# Patient Record
Sex: Male | Born: 1990 | Race: Black or African American | Hispanic: No | Marital: Single
Health system: Southern US, Community
[De-identification: ages and names within clinical notes are randomized; demographics above are authoritative.]

---
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
## 2009-05-04 ENCOUNTER — Emergency Department (HOSPITAL_COMMUNITY): Admission: EM | Admit: 2009-05-04 | Discharge: 2009-05-05 | Payer: Self-pay | Admitting: Emergency Medicine

## 2013-01-22 ENCOUNTER — Emergency Department (HOSPITAL_COMMUNITY): Payer: Self-pay

## 2013-01-22 ENCOUNTER — Emergency Department (HOSPITAL_COMMUNITY)
Admission: EM | Admit: 2013-01-22 | Discharge: 2013-01-22 | Disposition: A | Discharge status: Home / Self Care | Payer: Self-pay | Attending: Emergency Medicine | Admitting: Emergency Medicine

## 2013-01-22 ENCOUNTER — Encounter (HOSPITAL_COMMUNITY): Payer: Self-pay

## 2013-01-22 ENCOUNTER — Other Ambulatory Visit: Payer: Self-pay

## 2013-01-22 DIAGNOSIS — R0789 Other chest pain: Secondary | ICD-10-CM | POA: Insufficient documentation

## 2013-01-22 DIAGNOSIS — F17200 Nicotine dependence, unspecified, uncomplicated: Secondary | ICD-10-CM | POA: Insufficient documentation

## 2013-01-22 NOTE — ED Provider Notes (Signed)
History     CSN: [id number]  Arrival date & time 01/22/13  0006   First MD Initiated Contact with Patient 01/22/13 0211      Chief Complaint  Patient presents with  . Shortness of Breath   HPI  History provided by the patient. Patient is a 22 year old male with no significant PMH who is a current every day smoker who presents with complaints of left-sided chest pain. Patient reports having sharp stabbing pain to his left chest area earlier yesterday using while watching a basketball game. Pain remained persistent and seem to worsen with deep breathing. The pain "took his breath away" and patient reports feeling some shortness of breath. Pain was not worse with movements or activity. Pain did not radiate. He denies any other aggravating or alleviating factors. The patient states he was eating and smoking a cigarette prior to having symptoms. There was no associated diaphoresis or nausea. No coughing. No hemoptysis. He denies any recent travel. No pain or swelling extremities. No prior history of DVT or PE. Denies any other associated symptoms.     History reviewed. No pertinent past medical history.  History reviewed. No pertinent past surgical history.  History reviewed. No pertinent family history.  History  Substance Use Topics  . Smoking status: Current Every Day Smoker -- 0.50 packs/day  . Smokeless tobacco: Not on file  . Alcohol Use: No     Comment: social      Review of Systems  Constitutional: Negative for fever, chills and diaphoresis.  Respiratory: Positive for shortness of breath. Negative for cough.   Cardiovascular: Positive for chest pain. Negative for palpitations.  Gastrointestinal: Negative for nausea.  All other systems reviewed and are negative.    Allergies  Review of patient's allergies indicates no known allergies.  Home Medications   Current Outpatient Rx  Name  Route  Sig  Dispense  Refill  . ibuprofen (ADVIL,MOTRIN) 200 MG tablet   Oral  Take 200 mg by mouth every 6 (six) hours as needed for pain.           BP 127/75  Pulse 77  Temp(Src) 98.8 F (37.1 C) (Oral)  Resp 20  SpO2 100%  Physical Exam  Nursing note and vitals reviewed. Constitutional: He appears well-developed and well-nourished. No distress.  HENT:  Head: Normocephalic and atraumatic.  Eyes: Conjunctivae and EOM are normal. Pupils are equal, round, and reactive to light.  Cardiovascular: Normal rate and regular rhythm.   No murmur heard. Pulmonary/Chest: Effort normal and breath sounds normal. No respiratory distress. He has no wheezes. He has no rales. He exhibits no tenderness.  Abdominal: Soft. There is no tenderness. There is no rebound and no guarding.  Musculoskeletal: Normal range of motion. He exhibits no edema and no tenderness.  No clinical signs concerning for DVT.  Neurological: He is alert.  Skin: Skin is warm.  Psychiatric: He has a normal mood and affect. His behavior is normal.    ED Course  Procedures       Dg Chest 2 View  01/22/2013   *RADIOLOGY REPORT*  Clinical Data: New onset of mid chest pain and pressure.  History of smoking.  CHEST - 2 VIEW  Comparison: None.  Findings: The lungs are well-aerated and clear.  There is no evidence of focal opacification, pleural effusion or pneumothorax.  The heart is normal in size; the mediastinal contour is within normal limits.  No acute osseous abnormalities are seen.  IMPRESSION: No acute cardiopulmonary process  seen.   Original Report Authenticated By: Tonia Ghent, M.D.     1. Atypical chest pain       MDM  2:30AM is a patient seen and evaluated. Patient well-appearing in no acute distress. Currently has no pain at all. Normal respirations, O2 sats and heart rate. He is PERC negative.  Pain was sharp and atypical. Unremarkable ECG and chest x-ray. Patient without other significant risk factors for ACS or concerning cause of symptoms. This time we'll discharge home and  encourage PCP followup.     Date: 01/22/2013  Rate: 72  Rhythm: normal sinus rhythm  QRS Axis: normal  Intervals: normal  ST/T Wave abnormalities: nonspecific T wave changes  Conduction Disutrbances:none  Narrative Interpretation:   Old EKG Reviewed: none available          Angus Seller, PA-C 01/22/13 [phone]

## 2013-01-22 NOTE — ED Notes (Signed)
Per pt, shortness of breath starting tonight.  No cough congestion.  Chest discomfort noted.

## 2013-01-22 NOTE — ED Provider Notes (Signed)
Medical screening examination/treatment/procedure(s) were performed by non-physician practitioner and as supervising physician I was immediately available for consultation/collaboration.  John-Adam Dacy Enrico, M.D.   John-Adam Margaruite Top, MD 01/22/13 0529 

## 2014-02-25 ENCOUNTER — Encounter (HOSPITAL_COMMUNITY): Payer: Self-pay | Admitting: Emergency Medicine

## 2014-02-25 ENCOUNTER — Emergency Department (HOSPITAL_COMMUNITY)
Admission: EM | Admit: 2014-02-25 | Discharge: 2014-02-25 | Disposition: A | Discharge status: Home / Self Care | Payer: Self-pay | Attending: Emergency Medicine | Admitting: Emergency Medicine

## 2014-02-25 DIAGNOSIS — F17200 Nicotine dependence, unspecified, uncomplicated: Secondary | ICD-10-CM | POA: Insufficient documentation

## 2014-02-25 DIAGNOSIS — R21 Rash and other nonspecific skin eruption: Secondary | ICD-10-CM | POA: Insufficient documentation

## 2014-02-25 DIAGNOSIS — Z113 Encounter for screening for infections with a predominantly sexual mode of transmission: Secondary | ICD-10-CM | POA: Insufficient documentation

## 2014-02-25 MED ORDER — HYDROCORTISONE 2.5 % EX LOTN: TOPICAL_LOTION | Freq: Two times a day (BID) | CUTANEOUS | Status: DC

## 2014-02-25 MED ORDER — PREDNISONE 20 MG PO TABS: 60.0000 mg | ORAL_TABLET | Freq: Every day | ORAL | Status: DC

## 2014-02-25 NOTE — Progress Notes (Signed)
  CARE MANAGEMENT ED NOTE 02/25/2014  Patient:  Mark Banks,Mark Banks   Account Number:  [id number]  Date Initiated:  02/25/2014  Documentation initiated by:  Radford PaxFERRERO,Berwyn Bigley  Subjective/Objective Assessment:   Patient presents to Ed with itchy ras to upper back and bilateral arms.     Subjective/Objective Assessment Detail:     Action/Plan:   Action/Plan Detail:   Anticipated DC Date:  02/25/2014     Status Recommendation to Physician:   Result of Recommendation:    Other ED Services  Consult Working Plan    DC Planning Services  Other  PCP issues    Choice offered to / List presented to:            Status of service:  Completed, signed off  ED Comments:   ED Comments Detail:  EDCM spoke to patient at bedside.  Patient confirms he does not have insurance [place]. [facility] with [facility].  [facility] informed [facility] that walk [facility] are welcome at [facility] from Mon-Thurs at 9am to 1030am.  [facility] also provided patient with list of pcps who accept patients without insurance, list of discounted pharmacies and websites needymeds.org and GoodRX.com for medication assistance, phone number to inquire about Affordable care act and DSS for Medicaid for insurance, financiall resources in the community such as local churches and [employer], urban ministries, and dental assistance for patients without insurance.  Also informed patient that he may apply for the orange card at the [facility].  EDCM called [facility] to check on price of Prednisone prescription.  Patient agreeable to pay copay. Patient thankful for resources. No further EDCM needs at this time.

## 2014-02-25 NOTE — ED Notes (Signed)
Upon time for discharge, pt is now requesting STD check. EDPA notified.

## 2014-02-25 NOTE — Discharge Instructions (Signed)
Take Benadryl for itching.  Follow instructions on the box.

## 2014-02-25 NOTE — ED Notes (Signed)
Pt has rash over upper back and arms since yesterday raised and red that itch.

## 2014-02-25 NOTE — ED Provider Notes (Signed)
CSN: [id number]     Arrival date & time 02/25/14  1734 History   This chart was scribed for non-physician practitioner Santiago Glad working with Doug Sou, MD by Carl Best, ED Scribe. This patient was seen in room WTR8/WTR8 and the patient's care was started at 6:38 PM.     No chief complaint on file.  The history is provided by the patient. No language interpreter was used.   HPI Comments: Mark Banks is a 23 y.o. male who presents to the Emergency Department complaining of an itchy rash located over his upper back and arms bilaterally that he noticed today when he woke up this morning.  He states that the rash did not spread.  The patient denies lip, tongue, and throat swelling, SOB, trouble swallowing, fever, chills, nausea, or vomiting as associated symptoms.  The patient states that he has not taken any medication to treat his symptoms.  He states that he has not used any new detergents, soaps, lotions, or medications recently.    The patient states that he would like to be checked for STDs.  He denies scrotal rash, penile lesions, and penile discharge as associated symptoms.  He states that he has had unprotected sex.    No past medical history on file. No past surgical history on file. No family history on file. History  Substance Use Topics  . Smoking status: Current Every Day Smoker -- 0.50 packs/day  . Smokeless tobacco: Not on file  . Alcohol Use: No     Comment: social    Review of Systems  HENT: Negative for facial swelling and trouble swallowing.   Respiratory: Negative for shortness of breath.   Genitourinary: Negative for discharge and genital sores.  Skin: Positive for rash.  All other systems reviewed and are negative.     Allergies  Review of patient's allergies indicates no known allergies.  Home Medications   Prior to Admission medications   Medication Sig Start Date End Date Taking? Authorizing Provider  ibuprofen (ADVIL,MOTRIN) 200 MG  tablet Take 200 mg by mouth every 6 (six) hours as needed for pain.    Historical Provider, MD   Triage Vitals: BP 138/73  Pulse 91  Temp(Src) 98.2 F (36.8 C) (Oral)  Resp 16  SpO2 98%  Physical Exam  Nursing note and vitals reviewed. Constitutional: He appears well-developed and well-nourished.  HENT:  Head: Normocephalic and atraumatic.  Mouth/Throat: Uvula is midline and oropharynx is clear and moist. No uvula swelling. No oropharyngeal exudate.  No swelling of the lips, tongue, or uvula.    Eyes: EOM are normal. Pupils are equal, round, and reactive to light.  Neck: Normal range of motion. Neck supple.  Cardiovascular: Normal rate, regular rhythm and normal heart sounds.  Exam reveals no gallop and no friction rub.   No murmur heard. Pulmonary/Chest: Effort normal and breath sounds normal. No respiratory distress. He has no decreased breath sounds. He has no wheezes. He has no rhonchi. He has no rales.  Airways are widely patent.    Musculoskeletal: Normal range of motion.  Neurological: He is alert.  Skin: Skin is warm and dry. Rash noted. No petechiae and no purpura noted. There is erythema.  Erythematous papular blanchable rash located on the upper back bilaterally, mid-right back, and the left upper arm.  There is no drainage.    No rash on the web spaces of the fingers No rash on the palms or soles  Psychiatric: He has a normal mood  and affect. His behavior is normal.    ED Course  Procedures (including critical care time)  DIAGNOSTIC STUDIES: Oxygen Saturation is 98% on room air, normal by my interpretation.    COORDINATION OF CARE: 6:41 PM- Discussed a clinical suspicion of an allergic reaction and treating the patient's rash with a steroid cream and a five day course of prednisone.  Advised the patient to take Benadryl for the itching.  The patient agreed to the treatment plan.  7:25 PM- Discussed administering an STD test in the ED and the patient agreed to the  treatment plan.   Labs Review Labs Reviewed - No data to display  Imaging Review No results found.   EKG Interpretation None      MDM   Final diagnoses:  None   Patient presenting with a pruritic rash located on the arms and back.  No swelling of the lips, tongue, or throat.  No SOB or wheezing.  No nausea or vomiting.  Rash is not in a distribution consistent with Scabies or Syphilis.  Feel that the patient is stable for discharge.  Patient instructed to apply Hydrocortisone to the rash and also given Prednisone.  Patient also requesting STD testing.  Denies penile discharge.  No penile lesions.  GC/Chlmaydia pending.  HIV and Syphilis pending. Return precautions given.   Santiago GladHeather Trayvion Embleton, PA-C 02/25/14 2252

## 2014-02-26 LAB — GC/CHLAMYDIA PROBE AMP
CT Probe RNA: NEGATIVE
GC PROBE AMP APTIMA: POSITIVE — AB

## 2014-02-26 LAB — HIV ANTIBODY (ROUTINE TESTING W REFLEX): HIV: NONREACTIVE

## 2014-02-26 LAB — RPR

## 2014-02-26 NOTE — ED Provider Notes (Signed)
Medical screening examination/treatment/procedure(s) were performed by non-physician practitioner and as supervising physician I was immediately available for consultation/collaboration.   EKG Interpretation None       Akshara Blumenthal, MD 02/26/14 0047 

## 2014-02-27 ENCOUNTER — Telehealth (HOSPITAL_BASED_OUTPATIENT_CLINIC_OR_DEPARTMENT_OTHER): Payer: Self-pay | Admitting: Emergency Medicine

## 2014-02-27 NOTE — Telephone Encounter (Signed)
+  Gonorrhea. Chart sent to EDP office for review. DHHS attached. °

## 2014-03-04 ENCOUNTER — Telehealth (HOSPITAL_BASED_OUTPATIENT_CLINIC_OR_DEPARTMENT_OTHER): Payer: Self-pay

## 2014-03-04 NOTE — Telephone Encounter (Signed)
Chart reviewed by V. Pickering FNP "Zithromax 1,000 mg po x 1" and TO rcvd and verified for Suprax 400 mg po x 1 from Dr Dorris CarnesN. Pickering 7/17 @ 2023.  7/17 @ 2014 ID verified x 2. Pt informed of dx, need for addl tx call partner(s) for testing and tx and abstain from sex x 2 wks post tx.  DHHS form completed and faxed.

## 2016-05-15 ENCOUNTER — Emergency Department (HOSPITAL_COMMUNITY)
Admission: EM | Admit: 2016-05-15 | Discharge: 2016-05-16 | Disposition: A | Discharge status: Home / Self Care | Payer: BLUE CROSS/BLUE SHIELD | Attending: Emergency Medicine | Admitting: Emergency Medicine

## 2016-05-15 ENCOUNTER — Encounter (HOSPITAL_COMMUNITY): Payer: Self-pay

## 2016-05-15 DIAGNOSIS — R197 Diarrhea, unspecified: Secondary | ICD-10-CM | POA: Insufficient documentation

## 2016-05-15 DIAGNOSIS — R112 Nausea with vomiting, unspecified: Secondary | ICD-10-CM | POA: Diagnosis not present

## 2016-05-15 DIAGNOSIS — F17200 Nicotine dependence, unspecified, uncomplicated: Secondary | ICD-10-CM | POA: Diagnosis not present

## 2016-05-15 LAB — COMPREHENSIVE METABOLIC PANEL
ALBUMIN: 4.3 g/dL (ref 3.5–5.0)
ALT: 15 U/L — ABNORMAL LOW (ref 17–63)
AST: 18 U/L (ref 15–41)
Alkaline Phosphatase: 30 U/L — ABNORMAL LOW (ref 38–126)
Anion gap: 5 (ref 5–15)
BILIRUBIN TOTAL: 1.1 mg/dL (ref 0.3–1.2)
BUN: 11 mg/dL (ref 6–20)
CHLORIDE: 107 mmol/L (ref 101–111)
CO2: 28 mmol/L (ref 22–32)
Calcium: 9.2 mg/dL (ref 8.9–10.3)
Creatinine, Ser: 1.24 mg/dL (ref 0.61–1.24)
GFR calc Af Amer: >60 mL/min (ref >60)
GFR calc non Af Amer: >60 mL/min (ref >60)
GLUCOSE: 96 mg/dL (ref 65–99)
POTASSIUM: 3.8 mmol/L (ref 3.5–5.1)
SODIUM: 140 mmol/L (ref 135–145)
TOTAL PROTEIN: 6.5 g/dL (ref 6.5–8.1)

## 2016-05-15 LAB — CBC
HEMATOCRIT: 42.5 % (ref 39.0–52.0)
Hemoglobin: 14.1 g/dL (ref 13.0–17.0)
MCH: 32.3 pg (ref 26.0–34.0)
MCHC: 33.2 g/dL (ref 30.0–36.0)
MCV: 97.5 fL (ref 78.0–100.0)
Platelets: 241 10*3/uL (ref 150–400)
RBC: 4.36 MIL/uL (ref 4.22–5.81)
RDW: 11.6 % (ref 11.5–15.5)
WBC: 6.5 10*3/uL (ref 4.0–10.5)

## 2016-05-15 LAB — LIPASE, BLOOD: Lipase: 21 U/L (ref 11–51)

## 2016-05-15 NOTE — ED Triage Notes (Signed)
Pt states that he has had n/v/d today since noon, states he left work and needs a doctors note but does not wish to see a doctor. Abd pain 6/10

## 2016-05-16 NOTE — ED Notes (Signed)
Pt believes that he ate some bad food and that might be what is causing his issues.

## 2016-05-16 NOTE — ED Provider Notes (Signed)
[facility]-EMERGENCY DEPT Provider Note   CSN: [id number] Arrival date & time: 05/15/16  2209   History   Chief Complaint Chief Complaint  Patient presents with  . Emesis  . Diarrhea   HPI   Mark Banks is an 25 y.o. male who presents to the ED for evaluation of n/v/d. He states his symptoms started earlier this afternoon. He thinks he ate something bad. Reports generalized abdominal pain that was initially severe and crampy, has now resolved. States he has had 2-3 episodes of NBNB emesis. Denies current nausea. He reports "many" episodes of loose watery stools but none int he past couple of hours. He states he is here because he needs a note for work as he had to leave early. Denies fever or chills. Denies recent travel. Denies BRBPR or black, tarry stool.   History reviewed. No pertinent past medical history.  There are no active problems to display for this patient.   History reviewed. No pertinent surgical history.     Home Medications    Prior to Admission medications   Medication Sig Start Date End Date Taking? Authorizing Provider  hydrocortisone 2.5 % lotion Apply topically 2 (two) times daily. 02/25/14   Heather Laisure, PA-C  ibuprofen (ADVIL,MOTRIN) 200 MG tablet Take 200 mg by mouth every 6 (six) hours as needed for pain.    Historical Provider, MD  predniSONE (DELTASONE) 20 MG tablet Take 3 tablets (60 mg total) by mouth daily. 02/25/14   Santiago Glad, PA-C    Family History No family history on file.  Social History Social History  Substance Use Topics  . Smoking status: Current Every Day Smoker    Packs/day: 0.50  . Smokeless tobacco: Never Used  . Alcohol use No     Comment: social     Allergies   Review of patient's allergies indicates no known allergies.   Review of Systems Review of Systems 10 Systems reviewed and are negative for acute change except as noted in the HPI.  Physical Exam Updated Vital Signs BP 121/71   Pulse 62   Temp  98.8 F (37.1 C) (Oral)   Resp 16   Ht 5\' 6"  (1.676 m)   Wt 74.8 kg   SpO2 100%   BMI 26.63 kg/m   Physical Exam  Constitutional: He is oriented to person, place, and time.  HENT:  Right Ear: External ear normal.  Left Ear: External ear normal.  Nose: Nose normal.  Mouth/Throat: Oropharynx is clear and moist. No oropharyngeal exudate.  Eyes: Conjunctivae are normal.  Neck: Neck supple.  Cardiovascular: Normal rate, regular rhythm, normal heart sounds and intact distal pulses.   Pulmonary/Chest: Effort normal and breath sounds normal. No respiratory distress. He has no wheezes.  Abdominal: Soft. Bowel sounds are normal. He exhibits no distension. There is no tenderness. There is no rebound and no guarding.  Musculoskeletal: He exhibits no edema.  Lymphadenopathy:    He has no cervical adenopathy.  Neurological: He is alert and oriented to person, place, and time. No cranial nerve deficit.  Skin: Skin is warm and dry.  Psychiatric: He has a normal mood and affect.  Nursing note and vitals reviewed.    ED Treatments / Results  Labs (all labs ordered are listed, but only abnormal results are displayed) Labs Reviewed  COMPREHENSIVE METABOLIC PANEL - Abnormal; Notable for the following:       Result Value   ALT 15 (*)    Alkaline Phosphatase 30 (*)  All other components within normal limits  LIPASE, BLOOD  CBC    EKG  EKG Interpretation None       Radiology No results found.  Procedures Procedures (including critical care time)  Medications Ordered in ED Medications - No data to display   Initial Impression / Assessment and Plan / ED Course  I have reviewed the triage vital signs and the nursing notes.  Pertinent labs & imaging results that were available during my care of the patient were reviewed by me and considered in my medical decision making (see chart for details).  Clinical Course   Labs unrevealing. Exam benign. Pt pain free. No abdominal  tenderness or peritoneal signs. Has no nausea and tolerating PO. Declines rx for supportive meds. Work note given per request. ER return precautions given.  Final Clinical Impressions(s) / ED Diagnoses   Final diagnoses:  Nausea vomiting and diarrhea    New Prescriptions Discharge Medication List as of 05/16/2016 12:25 AM       Carlene CoriaSerena Y Iliya Spivack, PA-C 05/16/16 [id number]    Arby BarretteMarcy Pfeiffer, MD 05/16/16 2310

## 2016-05-16 NOTE — Discharge Instructions (Signed)
Your labs and exam were reassuring. As we discussed you did not want prescriptions for medicine. You can try over the counter Imodium if your diarrhea returns. Make sure to drink plenty of water to stay hydrated. Return to the ER for new or worsening symptoms.

## 2017-07-04 ENCOUNTER — Other Ambulatory Visit: Payer: Self-pay

## 2017-07-04 ENCOUNTER — Emergency Department (HOSPITAL_BASED_OUTPATIENT_CLINIC_OR_DEPARTMENT_OTHER)
Admission: EM | Admit: 2017-07-04 | Discharge: 2017-07-05 | Disposition: A | Discharge status: Home / Self Care | Payer: BLUE CROSS/BLUE SHIELD | Attending: Emergency Medicine | Admitting: Emergency Medicine

## 2017-07-04 ENCOUNTER — Encounter (HOSPITAL_BASED_OUTPATIENT_CLINIC_OR_DEPARTMENT_OTHER): Payer: Self-pay | Admitting: Emergency Medicine

## 2017-07-04 DIAGNOSIS — Z202 Contact with and (suspected) exposure to infections with a predominantly sexual mode of transmission: Secondary | ICD-10-CM | POA: Insufficient documentation

## 2017-07-04 DIAGNOSIS — Z79899 Other long term (current) drug therapy: Secondary | ICD-10-CM | POA: Insufficient documentation

## 2017-07-04 DIAGNOSIS — F1290 Cannabis use, unspecified, uncomplicated: Secondary | ICD-10-CM | POA: Insufficient documentation

## 2017-07-04 DIAGNOSIS — F17200 Nicotine dependence, unspecified, uncomplicated: Secondary | ICD-10-CM | POA: Insufficient documentation

## 2017-07-04 NOTE — ED Triage Notes (Signed)
Pt presents with wanting to be checked for STD after exposure possible STD

## 2017-07-05 LAB — RAPID HIV SCREEN (HIV 1/2 AB+AG)
HIV 1/2 ANTIBODIES: NONREACTIVE
HIV-1 P24 ANTIGEN - HIV24: NONREACTIVE

## 2017-07-05 MED ORDER — DOXYCYCLINE HYCLATE 100 MG PO CAPS: 100.0000 mg | ORAL_CAPSULE | Freq: Two times a day (BID) | ORAL | 0 refills | Status: DC

## 2017-07-05 MED ORDER — CEFTRIAXONE SODIUM 250 MG IJ SOLR
250.0000 mg | Freq: Once | INTRAMUSCULAR | Status: AC
  Administered 2017-07-05: 250 mg via INTRAMUSCULAR
  Filled 2017-07-05: qty 250

## 2017-07-05 MED ORDER — AZITHROMYCIN 250 MG PO TABS
1000.0000 mg | ORAL_TABLET | Freq: Once | ORAL | Status: AC
  Administered 2017-07-05: 1000 mg via ORAL
  Filled 2017-07-05: qty 4

## 2017-07-05 NOTE — ED Provider Notes (Signed)
[facility] EMERGENCY DEPARTMENT Provider Note   CSN: [id number] Arrival date & time: 07/04/17  2303     History   Chief Complaint Chief Complaint  Patient presents with  . Exposure to STD    HPI Mark Banks is a 26 y.o. male.  HPI  Mr. Mark Banks is a 26 year old male with no significant past medical history who presents emergency department for complaint of "I want to get checked for STDs."  He states that he is sexually active with one male partner who is [redacted]wks pregnant and recently developed malodorous vaginal discharge.  He states that he has not had any other partners in the past three months.  States that he has had STDs in the past, cannot remember which ones but states that they were treated with antibiotics.  He denies penile discharge, testicular pain, penile pain, penile rash or lesion, abdominal pain, groin pain, fever, dysuria, urinary frequency, hematuria, rashes, mouth sores.  History reviewed. No pertinent past medical history.  There are no active problems to display for this patient.   History reviewed. No pertinent surgical history.     Home Medications    Prior to Admission medications   Medication Sig Start Date End Date Taking? Authorizing Provider  doxycycline (VIBRAMYCIN) 100 MG capsule Take 1 capsule (100 mg total) 2 (two) times daily by mouth. 07/05/17   Mady GemmaShrosbree, Danella PentonEmily J, PA-C  hydrocortisone 2.5 % lotion Apply topically 2 (two) times daily. 02/25/14   Santiago GladLaisure, Heather, PA-C  ibuprofen (ADVIL,MOTRIN) 200 MG tablet Take 200 mg by mouth every 6 (six) hours as needed for pain.    [provider]  predniSONE (DELTASONE) 20 MG tablet Take 3 tablets (60 mg total) by mouth daily. 02/25/14   Santiago GladLaisure, Heather, PA-C    Family History No family history on file.  Social History Social History   Tobacco Use  . Smoking status: Current Every Day Smoker    Packs/day: 0.50  . Smokeless tobacco: Never Used  Substance Use Topics    . Alcohol use: No    Comment: social  . Drug use: Yes    Types: Marijuana     Allergies   Patient has no known allergies.   Review of Systems Review of Systems  Constitutional: Negative for chills, fatigue and fever.  Gastrointestinal: Negative for abdominal pain, nausea and vomiting.  Genitourinary: Negative for difficulty urinating, discharge, dysuria, frequency, hematuria, penile pain, scrotal swelling and testicular pain.  Musculoskeletal: Negative for gait problem.  Skin: Negative for rash.  All other systems reviewed and are negative.    Physical Exam Updated Vital Signs BP (!) 158/82 (BP Location: Left Arm)   Pulse 74   Temp 98.8 F (37.1 C) (Oral)   Resp 20   SpO2 100%   Physical Exam  Constitutional: He appears well-developed and well-nourished. No distress.  HENT:  Head: Normocephalic and atraumatic.  Mouth/Throat: Oropharynx is clear and moist. No oropharyngeal exudate.  Eyes: Conjunctivae are normal. Pupils are equal, round, and reactive to light. Right eye exhibits no discharge. Left eye exhibits no discharge.  Neck: Normal range of motion. Neck supple.  Cardiovascular: Normal rate, regular rhythm and intact distal pulses. Exam reveals no friction rub.  No murmur heard. Pulmonary/Chest: Effort normal. No stridor. No respiratory distress. He has no wheezes. He has no rales.  Abdominal: Soft. Bowel sounds are normal. He exhibits no distension. There is no tenderness. There is no guarding.  Genitourinary:  Genitourinary Comments: Chaperone present for exam. Circumcised  penis. No discharge from penis. No signs of lesion or erythema on the penis or testicles. The penis and testicles are nontender. No testicular masses or swelling.   Musculoskeletal: Normal range of motion.  Lymphadenopathy:    He has no cervical adenopathy.  Neurological: He is alert. Coordination normal.  Skin: He is not diaphoretic.  Psychiatric: He has a normal mood and affect. His  behavior is normal.  Nursing note and vitals reviewed.    ED Treatments / Results  Labs (all labs ordered are listed, but only abnormal results are displayed) Labs Reviewed  RAPID HIV SCREEN (HIV 1/2 AB+AG)  RPR  GC/CHLAMYDIA PROBE AMP ([place]) NOT AT [facility]    EKG  EKG Interpretation None       Radiology No results found.  Procedures Procedures (including critical care time)  Medications Ordered in ED Medications  cefTRIAXone (ROCEPHIN) injection 250 mg (250 mg Intramuscular Given 07/05/17 0033)  azithromycin (ZITHROMAX) tablet 1,000 mg (1,000 mg Oral Given 07/05/17 0030)     Initial Impression / Assessment and Plan / ED Course  I have reviewed the triage vital signs and the nursing notes.  Pertinent labs & imaging results that were available during my care of the patient were reviewed by me and considered in my medical decision making (see chart for details).     Patient is afebrile without abdominal tenderness, abdominal pain or painful bowel movements to indicate prostatitis.  No tenderness to palpation of the testes or epididymis to suggest orchitis or epididymitis. STD cultures obtained including HIV, syphilis, gonorrhea and chlamydia. Discussed importance of using protection when sexually active. Pt understands that they have GC/Chlamydia cultures pending and that they will need to inform all sexual partners if results return positive. Patient has been treated prophylactically with azithromycin and Rocephin. Given prescription for doxycycline at home. Discussed this patient with Dr. Nicanor AlconPalumbo who agrees with plan.    Final Clinical Impressions(s) / ED Diagnoses   Final diagnoses:  STD exposure    ED Discharge Orders        Ordered    doxycycline (VIBRAMYCIN) 100 MG capsule  2 times daily     07/05/17 0054       Kellie ShropshireShrosbree, Arwyn Besaw J, PA-C 07/05/17 0055    Palumbo, April, MD 07/05/17 [id number]

## 2017-07-05 NOTE — Discharge Instructions (Signed)
Please fill your prescription for antibiotic and take twice a day for the next 7 days.  Please refrain from sexual intercourse until you are fully treated.  You have chlamydia and gonorrhea cultures which are pending.  You will get a call in the next 3 days if they are positive.  If they are positive please inform all sexual partners.    Please return to the emergency department if you have any new or worsening symptoms.

## 2017-07-06 LAB — RPR: RPR Ser Ql: NONREACTIVE

## 2017-07-07 LAB — GC/CHLAMYDIA PROBE AMP (~~LOC~~) NOT AT ARMC
Chlamydia: NEGATIVE
NEISSERIA GONORRHEA: NEGATIVE

## 2018-10-16 ENCOUNTER — Ambulatory Visit (HOSPITAL_COMMUNITY)
Admission: EM | Admit: 2018-10-16 | Discharge: 2018-10-16 | Disposition: A | Discharge status: Home / Self Care | Payer: Managed Care, Other (non HMO) | Attending: Family Medicine | Admitting: Family Medicine

## 2018-10-16 ENCOUNTER — Encounter (HOSPITAL_COMMUNITY): Payer: Self-pay | Admitting: *Deleted

## 2018-10-16 ENCOUNTER — Other Ambulatory Visit: Payer: Self-pay

## 2018-10-16 DIAGNOSIS — Z113 Encounter for screening for infections with a predominantly sexual mode of transmission: Secondary | ICD-10-CM | POA: Diagnosis not present

## 2018-10-16 DIAGNOSIS — Z202 Contact with and (suspected) exposure to infections with a predominantly sexual mode of transmission: Secondary | ICD-10-CM | POA: Insufficient documentation

## 2018-10-16 MED ORDER — CEFTRIAXONE SODIUM 250 MG IJ SOLR
250.0000 mg | Freq: Once | INTRAMUSCULAR | Status: AC
  Administered 2018-10-16: 250 mg via INTRAMUSCULAR

## 2018-10-16 MED ORDER — AZITHROMYCIN 250 MG PO TABS
1000.0000 mg | ORAL_TABLET | Freq: Once | ORAL | Status: AC
  Administered 2018-10-16: 1000 mg via ORAL

## 2018-10-16 MED ORDER — CEFTRIAXONE SODIUM 250 MG IJ SOLR
INTRAMUSCULAR | Status: AC
  Filled 2018-10-16: qty 250

## 2018-10-16 MED ORDER — AZITHROMYCIN 250 MG PO TABS
ORAL_TABLET | ORAL | Status: AC
  Filled 2018-10-16: qty 4

## 2018-10-16 NOTE — ED Triage Notes (Signed)
States his girlfriend was treated for std and he want to be treated as well.

## 2018-10-16 NOTE — ED Provider Notes (Signed)
[facility]    CSN: [id number] Arrival date & time: 10/16/18  1424     History   Chief Complaint Chief Complaint  Patient presents with  . Exposure to STD    HPI Mark Banks is a 28 y.o. male.   She is a 28 year old male who presents for STD screening and treatment.  Reports that his girlfriend told him she was positive for chlamydia.  He is not currently having any symptoms.     History reviewed. No pertinent past medical history.  There are no active problems to display for this patient.   History reviewed. No pertinent surgical history.     Home Medications    Prior to Admission medications   Medication Sig Start Date End Date Taking? Authorizing Provider  doxycycline (VIBRAMYCIN) 100 MG capsule Take 1 capsule (100 mg total) 2 (two) times daily by mouth. 07/05/17   Mady Gemma, Danella Penton, PA-C  hydrocortisone 2.5 % lotion Apply topically 2 (two) times daily. 02/25/14   Santiago Glad, PA-C  ibuprofen (ADVIL,MOTRIN) 200 MG tablet Take 200 mg by mouth every 6 (six) hours as needed for pain.    [provider]  predniSONE (DELTASONE) 20 MG tablet Take 3 tablets (60 mg total) by mouth daily. 02/25/14   Santiago Glad, PA-C    Family History No family history on file.  Social History Social History   Tobacco Use  . Smoking status: Current Every Day Smoker    Packs/day: 0.50  . Smokeless tobacco: Never Used  Substance Use Topics  . Alcohol use: Yes    Comment: social  . Drug use: Yes    Types: Marijuana     Allergies   Patient has no known allergies.   Review of Systems Review of Systems  Genitourinary: Negative for decreased urine volume, difficulty urinating, discharge, dysuria, enuresis, flank pain, frequency, genital sores, hematuria, penile pain, penile swelling, scrotal swelling, testicular pain and urgency.     Physical Exam Triage Vital Signs ED Triage Vitals  Enc Vitals Group     BP 10/16/18 1505 130/69   Pulse Rate 10/16/18 1505 78     Resp 10/16/18 1505 12     Temp 10/16/18 1505 98.1 F (36.7 C)     Temp Source 10/16/18 1505 Oral     SpO2 10/16/18 1505 100 %     Weight --      Height --      Head Circumference --      Peak Flow --      Pain Score 10/16/18 1517 0     Pain Loc --      Pain Edu? --      Excl. in [place]? --    No data found.  Updated Vital Signs BP 130/69 (BP Location: Right Arm)   Pulse 78   Temp 98.1 F (36.7 C) (Oral)   Resp 12   SpO2 100%   Visual Acuity Right Eye Distance:   Left Eye Distance:   Bilateral Distance:    Right Eye Near:   Left Eye Near:    Bilateral Near:     Physical Exam Vitals signs and nursing note reviewed.  Constitutional:      General: He is not in acute distress.    Appearance: Normal appearance. He is well-developed. He is not ill-appearing, toxic-appearing or diaphoretic.  HENT:     Head: Normocephalic and atraumatic.     Nose: Nose normal.     Mouth/Throat:  Pharynx: Oropharynx is clear.  Eyes:     Conjunctiva/sclera: Conjunctivae normal.  Neck:     Musculoskeletal: Normal range of motion.  Pulmonary:     Effort: Pulmonary effort is normal.  Abdominal:     Palpations: Abdomen is soft.     Tenderness: There is no abdominal tenderness.  Musculoskeletal: Normal range of motion.  Skin:    General: Skin is warm and dry.  Neurological:     Mental Status: He is alert.  Psychiatric:        Mood and Affect: Mood normal.      UC Treatments / Results  Labs (all labs ordered are listed, but only abnormal results are displayed) Labs Reviewed  URINE CYTOLOGY ANCILLARY ONLY    EKG None  Radiology No results found.  Procedures Procedures (including critical care time)  Medications Ordered in UC Medications  cefTRIAXone (ROCEPHIN) injection 250 mg (250 mg Intramuscular Given 10/16/18 1552)  azithromycin (ZITHROMAX) tablet 1,000 mg (1,000 mg Oral Given 10/16/18 1552)    Initial Impression / Assessment and  Plan / UC Course  I have reviewed the triage vital signs and the nursing notes.  Pertinent labs & imaging results that were available during my care of the patient were reviewed by me and considered in my medical decision making (see chart for details).     Exposure to chlamydia Treating prophylactically today in clinic Urine sent for cytology Lab results pending we will call with any positive results Final Clinical Impressions(s) / UC Diagnoses   Final diagnoses:  Possible exposure to STD     Discharge Instructions     We are treating you for possibility of exposure to STDs today. We will send urine for testing and call you with any positive results    ED Prescriptions    None     Controlled Substance Prescriptions [place] Controlled Substance Registry consulted? Not Applicable   Janace Aris, NP 10/16/18 1601

## 2018-10-16 NOTE — Discharge Instructions (Addendum)
We are treating you for possibility of exposure to STDs today. We will send urine for testing and call you with any positive results

## 2018-10-19 ENCOUNTER — Telehealth (HOSPITAL_COMMUNITY): Payer: Self-pay | Admitting: Emergency Medicine

## 2018-10-19 LAB — URINE CYTOLOGY ANCILLARY ONLY
CHLAMYDIA, DNA PROBE: POSITIVE — AB
NEISSERIA GONORRHEA: NEGATIVE
Trichomonas: NEGATIVE

## 2018-10-19 NOTE — Telephone Encounter (Signed)
Chlamydia is positive.  This was treated at the urgent care visit with po zithromax 1g.  Pt needs education to please refrain from sexual intercourse for 7 days to give the medicine time to work.  Sexual partners need to be notified and tested/treated.  Condoms may reduce risk of reinfection.  Recheck or followup with PCP for further evaluation if symptoms are not improving.  [facility] notified.  Attempted to reach patient. No answer at this time. Voicemail left.    

## 2018-10-21 ENCOUNTER — Telehealth (HOSPITAL_COMMUNITY): Payer: Self-pay | Admitting: Emergency Medicine

## 2018-10-21 NOTE — Telephone Encounter (Signed)
Attempted call x 2 no answer and LVMM 

## 2018-10-23 ENCOUNTER — Telehealth (HOSPITAL_COMMUNITY): Payer: Self-pay | Admitting: Emergency Medicine

## 2018-10-23 NOTE — Telephone Encounter (Signed)
Spoke with pt verbalized understanding of results  

## 2019-02-12 ENCOUNTER — Encounter (HOSPITAL_COMMUNITY): Payer: Self-pay

## 2019-02-12 ENCOUNTER — Other Ambulatory Visit: Payer: Self-pay

## 2019-02-12 ENCOUNTER — Ambulatory Visit (HOSPITAL_COMMUNITY)
Admission: EM | Admit: 2019-02-12 | Discharge: 2019-02-12 | Disposition: A | Discharge status: Home / Self Care | Payer: Managed Care, Other (non HMO) | Attending: Family Medicine | Admitting: Family Medicine

## 2019-02-12 DIAGNOSIS — T7840XA Allergy, unspecified, initial encounter: Secondary | ICD-10-CM | POA: Diagnosis not present

## 2019-02-12 DIAGNOSIS — R21 Rash and other nonspecific skin eruption: Secondary | ICD-10-CM

## 2019-02-12 MED ORDER — HYDROXYZINE HCL 25 MG PO TABS: 25.0000 mg | ORAL_TABLET | ORAL | 0 refills | Status: AC | PRN

## 2019-02-12 MED ORDER — METHYLPREDNISOLONE 4 MG PO TBPK: ORAL_TABLET | ORAL | 0 refills | Status: AC

## 2019-02-12 NOTE — ED Provider Notes (Signed)
[place]    CSN: [id number] Arrival date & time: 02/12/19  1728      History   Chief Complaint Chief Complaint  Patient presents with  . Rash    HPI Mark Banks is a 28 y.o. male.   HPI  Patient states he has a rash "all over my body".  Actually it spares his palms, soles, and face.  He has on his arms legs chest and back.  The bumps itch.  Small blisters.  No difficulty breathing, swallowing, talking. No known allergies No new medicine.  Supplement.  Soap, lotion, powder, or product. No known exposure to chemicals in the workplace He has been using cortisone cream.  Benadryl for the itching.  The itching is still severe, keeps him awake.  He missed work today  History reviewed. No pertinent past medical history.  There are no active problems to display for this patient.   History reviewed. No pertinent surgical history.     Home Medications    Prior to Admission medications   Medication Sig Start Date End Date Taking? Authorizing Provider  hydrOXYzine (ATARAX/VISTARIL) 25 MG tablet Take 1-2 tablets (25-50 mg total) by mouth every 4 (four) hours as needed. 02/12/19   Raylene Everts, MD  methylPREDNISolone (MEDROL DOSEPAK) 4 MG TBPK tablet tad 02/12/19   Raylene Everts, MD    Family History Family History  Problem Relation Age of Onset  . Healthy Mother   . Healthy Father     Social History Social History   Tobacco Use  . Smoking status: Current Every Day Smoker    Packs/day: 0.50  . Smokeless tobacco: Never Used  Substance Use Topics  . Alcohol use: Yes    Comment: social  . Drug use: Yes    Types: Marijuana     Allergies   Patient has no known allergies.   Review of Systems Review of Systems  Constitutional: Negative for chills and fever.  HENT: Negative for ear pain and sore throat.   Eyes: Negative for pain and visual disturbance.  Respiratory: Negative for cough and shortness of breath.   Cardiovascular:  Negative for chest pain and palpitations.  Gastrointestinal: Negative for abdominal pain and vomiting.  Genitourinary: Negative for dysuria and hematuria.  Musculoskeletal: Negative for arthralgias and back pain.  Skin: Positive for rash. Negative for color change.  Neurological: Negative for seizures and syncope.  All other systems reviewed and are negative.    Physical Exam Triage Vital Signs ED Triage Vitals  Enc Vitals Group     BP 02/12/19 1814 133/72     Pulse Rate 02/12/19 1814 76     Resp 02/12/19 1814 16     Temp 02/12/19 1814 98.5 F (36.9 C)     Temp Source 02/12/19 1814 Oral     SpO2 02/12/19 1814 99 %     Weight --      Height --      Head Circumference --      Peak Flow --      Pain Score 02/12/19 1812 0     Pain Loc --      Pain Edu? --      Excl. in [place]? --    No data found.  Updated Vital Signs BP 133/72 (BP Location: Left Arm)   Pulse 76   Temp 98.5 F (36.9 C) (Oral)   Resp 16   SpO2 99%       Physical Exam Constitutional:  General: He is not in acute distress.    Appearance: He is well-developed.  HENT:     Head: Normocephalic and atraumatic.  Eyes:     Conjunctiva/sclera: Conjunctivae normal.     Pupils: Pupils are equal, round, and reactive to light.  Neck:     Musculoskeletal: Normal range of motion.  Cardiovascular:     Rate and Rhythm: Normal rate and regular rhythm.     Heart sounds: Normal heart sounds.  Pulmonary:     Effort: Pulmonary effort is normal. No respiratory distress.     Breath sounds: Normal breath sounds.  Abdominal:     General: There is no distension.     Palpations: Abdomen is soft.  Musculoskeletal: Normal range of motion.  Skin:    General: Skin is warm and dry.     Comments: Scattered fine papular rash many with vesicles across neck, trunk, back, arms and legs.  Neurological:     Mental Status: He is alert.  Psychiatric:        Mood and Affect: Mood normal.      UC Treatments / Results  Labs  (all labs ordered are listed, but only abnormal results are displayed) Labs Reviewed - No data to display  EKG None  Radiology No results found.  Procedures Procedures (including critical care time)  Medications Ordered in UC Medications - No data to display  Initial Impression / Assessment and Plan / UC Course  I have reviewed the triage vital signs and the nursing notes.  Pertinent labs & imaging results that were available during my care of the patient were reviewed by me and considered in my medical decision making (see chart for details).     Allergic reaction.  Unclear source.  Discussed treatment. Final Clinical Impressions(s) / UC Diagnoses   Final diagnoses:  Allergic reaction, initial encounter  Rash     Discharge Instructions     You may continue using the cortisone cream or any lotion for the itching Take the hydroxyzine as needed for itching.  This may cause drowsiness.  It is useful at bedtime. Take the prednisone pack as directed.  (Methylprednisolone).  Today take all of day 1, 3 now and then 3 at bedtime You should see improvement within 12 to 24 hours Call or return for problems   ED Prescriptions    Medication Sig Dispense Auth. Provider   hydrOXYzine (ATARAX/VISTARIL) 25 MG tablet Take 1-2 tablets (25-50 mg total) by mouth every 4 (four) hours as needed. 20 tablet Eustace MooreNelson, Shauni Henner Sue, MD   methylPREDNISolone (MEDROL DOSEPAK) 4 MG TBPK tablet tad 21 tablet Eustace MooreNelson, Belia Febo Sue, MD     Controlled Substance Prescriptions [place] Controlled Substance Registry consulted? Not Applicable   Eustace MooreNelson, Dayzee Trower Sue, MD 02/12/19 [phone]

## 2019-02-12 NOTE — ED Triage Notes (Signed)
Patient presents to Urgent Care with complaints of rash on whole body since a few days ago. Patient reports he has tried cortisone-10 cream and benadryl with minimal improvement, pt not sure what he got into.

## 2019-02-12 NOTE — Discharge Instructions (Signed)
You may continue using the cortisone cream or any lotion for the itching Take the hydroxyzine as needed for itching.  This may cause drowsiness.  It is useful at bedtime. Take the prednisone pack as directed.  (Methylprednisolone).  Today take all of day 1, 3 now and then 3 at bedtime You should see improvement within 12 to 24 hours Call or return for problems

## 2019-09-09 ENCOUNTER — Ambulatory Visit: Payer: Managed Care, Other (non HMO) | Attending: Internal Medicine

## 2019-09-09 DIAGNOSIS — Z20822 Contact with and (suspected) exposure to covid-19: Secondary | ICD-10-CM

## 2019-09-10 LAB — NOVEL CORONAVIRUS, NAA: SARS-CoV-2, NAA: NOT DETECTED

## 2019-10-25 ENCOUNTER — Ambulatory Visit: Payer: Managed Care, Other (non HMO) | Attending: Internal Medicine

## 2019-10-25 DIAGNOSIS — Z20822 Contact with and (suspected) exposure to covid-19: Secondary | ICD-10-CM

## 2019-10-26 LAB — NOVEL CORONAVIRUS, NAA: SARS-CoV-2, NAA: NOT DETECTED

## 2020-04-12 ENCOUNTER — Other Ambulatory Visit: Payer: Managed Care, Other (non HMO)

## 2020-04-12 ENCOUNTER — Other Ambulatory Visit: Payer: Self-pay | Admitting: Radiology

## 2020-04-12 DIAGNOSIS — Z20822 Contact with and (suspected) exposure to covid-19: Secondary | ICD-10-CM

## 2020-04-14 LAB — SARS-COV-2, NAA 2 DAY TAT

## 2020-04-14 LAB — NOVEL CORONAVIRUS, NAA: SARS-CoV-2, NAA: NOT DETECTED

## 2020-08-31 ENCOUNTER — Other Ambulatory Visit: Payer: Managed Care, Other (non HMO)

## 2020-08-31 ENCOUNTER — Other Ambulatory Visit: Payer: Self-pay

## 2020-08-31 DIAGNOSIS — Z20822 Contact with and (suspected) exposure to covid-19: Secondary | ICD-10-CM

## 2020-09-02 ENCOUNTER — Telehealth: Payer: Self-pay

## 2020-09-02 NOTE — Telephone Encounter (Signed)
Advised pt that results are not back.

## 2020-09-05 LAB — NOVEL CORONAVIRUS, NAA: SARS-CoV-2, NAA: NOT DETECTED

## 2021-11-18 ENCOUNTER — Emergency Department (HOSPITAL_COMMUNITY): Payer: Self-pay

## 2021-11-18 ENCOUNTER — Encounter (HOSPITAL_COMMUNITY): Admission: EM | Disposition: E | Payer: Self-pay | Source: Home / Self Care | Attending: Emergency Medicine

## 2021-11-18 ENCOUNTER — Emergency Department (HOSPITAL_COMMUNITY): Payer: Self-pay | Admitting: Anesthesiology

## 2021-11-18 ENCOUNTER — Emergency Department (EMERGENCY_DEPARTMENT_HOSPITAL): Payer: Self-pay | Admitting: Anesthesiology

## 2021-11-18 ENCOUNTER — Emergency Department (HOSPITAL_COMMUNITY)
Admission: EM | Admit: 2021-11-18 | Discharge: 2021-12-17 | Disposition: E | Payer: Self-pay | Attending: Emergency Medicine | Admitting: Emergency Medicine

## 2021-11-18 DIAGNOSIS — R0603 Acute respiratory distress: Secondary | ICD-10-CM | POA: Insufficient documentation

## 2021-11-18 DIAGNOSIS — Y92410 Unspecified street and highway as the place of occurrence of the external cause: Secondary | ICD-10-CM | POA: Insufficient documentation

## 2021-11-18 DIAGNOSIS — K0381 Cracked tooth: Secondary | ICD-10-CM | POA: Diagnosis not present

## 2021-11-18 DIAGNOSIS — T07XXXA Unspecified multiple injuries, initial encounter: Secondary | ICD-10-CM

## 2021-11-18 DIAGNOSIS — S80811A Abrasion, right lower leg, initial encounter: Secondary | ICD-10-CM | POA: Insufficient documentation

## 2021-11-18 DIAGNOSIS — R001 Bradycardia, unspecified: Secondary | ICD-10-CM | POA: Insufficient documentation

## 2021-11-18 DIAGNOSIS — S36892A Contusion of other intra-abdominal organs, initial encounter: Secondary | ICD-10-CM

## 2021-11-18 DIAGNOSIS — S299XXA Unspecified injury of thorax, initial encounter: Secondary | ICD-10-CM | POA: Diagnosis present

## 2021-11-18 DIAGNOSIS — S20312A Abrasion of left front wall of thorax, initial encounter: Secondary | ICD-10-CM | POA: Insufficient documentation

## 2021-11-18 DIAGNOSIS — K1379 Other lesions of oral mucosa: Secondary | ICD-10-CM | POA: Insufficient documentation

## 2021-11-18 DIAGNOSIS — R040 Epistaxis: Secondary | ICD-10-CM | POA: Diagnosis not present

## 2021-11-18 HISTORY — PX: LAPAROTOMY: SHX154

## 2021-11-18 LAB — PREPARE PLATELET PHERESIS: Unit division: 0

## 2021-11-18 LAB — PREPARE CRYOPRECIPITATE
Unit division: 0
Unit division: 0
Unit division: 0

## 2021-11-18 LAB — BPAM CRYOPRECIPITATE
Blood Product Expiration Date: 202304022145
Blood Product Expiration Date: 202304022145
Blood Product Expiration Date: 202304022145
ISSUE DATE / TIME: 202304021600
ISSUE DATE / TIME: 202304021600
ISSUE DATE / TIME: 202304021600
Unit Type and Rh: 6200
Unit Type and Rh: 6200
Unit Type and Rh: 6200

## 2021-11-18 LAB — BPAM PLATELET PHERESIS
Blood Product Expiration Date: 202304042359
ISSUE DATE / TIME: 202304021541
Unit Type and Rh: 6200

## 2021-11-18 LAB — MASSIVE TRANSFUSION PROTOCOL ORDER (BLOOD BANK NOTIFICATION)

## 2021-11-18 SURGERY — LAPAROTOMY, EXPLORATORY
Anesthesia: General | Site: Abdomen

## 2021-11-18 MED ORDER — 0.9 % SODIUM CHLORIDE (POUR BTL) OPTIME
TOPICAL | Status: DC | PRN
  Administered 2021-11-18: 3000 mL

## 2021-11-18 MED ORDER — SODIUM CHLORIDE 0.9 % IV SOLN
INTRAVENOUS | Status: AC | PRN
  Administered 2021-11-18: 1000 mL via INTRAVENOUS

## 2021-11-18 MED ORDER — EPINEPHRINE PF 1 MG/ML IJ SOLN
INTRAMUSCULAR | Status: DC | PRN
  Administered 2021-11-18 (×4): 1 mg via INTRAVENOUS

## 2021-11-18 MED ORDER — EPINEPHRINE 1 MG/10ML IJ SOSY
PREFILLED_SYRINGE | INTRAMUSCULAR | Status: AC | PRN
  Administered 2021-11-18 (×4): 1 mg via INTRAVENOUS

## 2021-11-18 MED ORDER — SODIUM BICARBONATE 8.4 % IV SOLN
INTRAVENOUS | Status: DC | PRN
  Administered 2021-11-18 (×3): 50 meq via INTRAVENOUS

## 2021-11-18 MED ORDER — ROCURONIUM BROMIDE 100 MG/10ML IV SOLN
INTRAVENOUS | Status: DC | PRN
  Administered 2021-11-18: 100 mg via INTRAVENOUS

## 2021-11-18 MED ORDER — SUCCINYLCHOLINE CHLORIDE 20 MG/ML IJ SOLN
INTRAMUSCULAR | Status: AC | PRN
  Administered 2021-11-18: 100 mg via INTRAVENOUS

## 2021-11-18 MED ORDER — ETOMIDATE 2 MG/ML IV SOLN
INTRAVENOUS | Status: AC | PRN
  Administered 2021-11-18: 20 mg via INTRAVENOUS

## 2021-11-18 MED ORDER — SODIUM CHLORIDE 0.9 % IV SOLN: INTRAVENOUS | Status: DC | PRN

## 2021-11-18 SURGICAL SUPPLY — 39 items
BLADE CLIPPER SURG (BLADE) IMPLANT
CANISTER SUCT 3000ML PPV (MISCELLANEOUS) ×2 IMPLANT
CHLORAPREP W/TINT 26 (MISCELLANEOUS) ×2 IMPLANT
COVER SURGICAL LIGHT HANDLE (MISCELLANEOUS) ×2 IMPLANT
DRAPE LAPAROSCOPIC ABDOMINAL (DRAPES) ×2 IMPLANT
DRAPE UNIVERSAL (DRAPES) ×2 IMPLANT
DRAPE WARM FLUID 44X44 (DRAPES) ×2 IMPLANT
DRSG OPSITE POSTOP 4X10 (GAUZE/BANDAGES/DRESSINGS) IMPLANT
DRSG OPSITE POSTOP 4X8 (GAUZE/BANDAGES/DRESSINGS) IMPLANT
ELECT BLADE 6.5 EXT (BLADE) ×1 IMPLANT
ELECT CAUTERY BLADE 6.4 (BLADE) ×2 IMPLANT
ELECT REM PT RETURN 9FT ADLT (ELECTROSURGICAL) ×2
ELECTRODE REM PT RTRN 9FT ADLT (ELECTROSURGICAL) ×1 IMPLANT
GLOVE SURG ENC MOIS LTX SZ6.5 (GLOVE) ×2 IMPLANT
GLOVE SURG UNDER POLY LF SZ6 (GLOVE) ×2 IMPLANT
GOWN STRL REUS W/ TWL LRG LVL3 (GOWN DISPOSABLE) ×2 IMPLANT
GOWN STRL REUS W/TWL LRG LVL3 (GOWN DISPOSABLE) ×4
HANDLE SUCTION POOLE (INSTRUMENTS) ×1 IMPLANT
KIT BASIN OR (CUSTOM PROCEDURE TRAY) ×2 IMPLANT
KIT TURNOVER KIT B (KITS) ×2 IMPLANT
LIGASURE IMPACT 36 18CM CVD LR (INSTRUMENTS) IMPLANT
NS IRRIG 1000ML POUR BTL (IV SOLUTION) ×5 IMPLANT
PACK GENERAL/GYN (CUSTOM PROCEDURE TRAY) ×2 IMPLANT
PAD ARMBOARD 7.5X6 YLW CONV (MISCELLANEOUS) ×2 IMPLANT
PENCIL SMOKE EVACUATOR (MISCELLANEOUS) ×2 IMPLANT
SPONGE T-LAP 18X18 ~~LOC~~+RFID (SPONGE) ×8 IMPLANT
STAPLER VISISTAT 35W (STAPLE) ×1 IMPLANT
SUCTION POOLE HANDLE (INSTRUMENTS) ×2
SUT ETHILON 1 LR 30 (SUTURE) ×1 IMPLANT
SUT PDS AB 1 TP1 54 (SUTURE) IMPLANT
SUT PDS AB 1 TP1 96 (SUTURE) IMPLANT
SUT SILK 2 0 SH CR/8 (SUTURE) ×1 IMPLANT
SUT SILK 2 0 TIES 10X30 (SUTURE) ×1 IMPLANT
SUT SILK 3 0 SH CR/8 (SUTURE) ×1 IMPLANT
SUT SILK 3 0 TIES 10X30 (SUTURE) ×1 IMPLANT
SUT VIC AB 3-0 SH 18 (SUTURE) IMPLANT
TOWEL GREEN STERILE (TOWEL DISPOSABLE) ×2 IMPLANT
TRAY FOLEY MTR SLVR 16FR STAT (SET/KITS/TRAYS/PACK) ×1 IMPLANT
YANKAUER SUCT BULB TIP NO VENT (SUCTIONS) ×1 IMPLANT

## 2021-11-19 ENCOUNTER — Encounter (HOSPITAL_COMMUNITY): Payer: Self-pay | Admitting: Surgery

## 2021-11-19 LAB — BPAM FFP
Blood Product Expiration Date: 202304062359
Blood Product Expiration Date: 202304062359
Blood Product Expiration Date: 202304072359
Blood Product Expiration Date: 202304072359
Blood Product Expiration Date: 202304072359
Blood Product Expiration Date: 202304072359
Blood Product Expiration Date: 202304072359
Blood Product Expiration Date: 202304072359
Blood Product Expiration Date: 202304072359
Blood Product Expiration Date: 202304072359
Blood Product Expiration Date: 202304192359
Blood Product Expiration Date: 202304192359
Blood Product Expiration Date: 202304192359
Blood Product Expiration Date: 202304202359
Blood Product Expiration Date: 202304202359
Blood Product Expiration Date: 202304232359
Blood Product Expiration Date: 202304232359
ISSUE DATE / TIME: 202304021526
ISSUE DATE / TIME: 202304021526
ISSUE DATE / TIME: 202304021529
ISSUE DATE / TIME: 202304021530
ISSUE DATE / TIME: 202304021530
ISSUE DATE / TIME: 202304021530
ISSUE DATE / TIME: 202304021530
ISSUE DATE / TIME: 202304021530
ISSUE DATE / TIME: 202304021545
ISSUE DATE / TIME: 202304021554
ISSUE DATE / TIME: 202304021554
ISSUE DATE / TIME: 202304021554
ISSUE DATE / TIME: 202304021554
ISSUE DATE / TIME: 202304021605
ISSUE DATE / TIME: 202304021605
ISSUE DATE / TIME: 202304021605
ISSUE DATE / TIME: 202304021605
Unit Type and Rh: 6200
Unit Type and Rh: 6200
Unit Type and Rh: 6200
Unit Type and Rh: 6200
Unit Type and Rh: 6200
Unit Type and Rh: 6200
Unit Type and Rh: 6200
Unit Type and Rh: 6200
Unit Type and Rh: 6200
Unit Type and Rh: 6200
Unit Type and Rh: 6200
Unit Type and Rh: 6200
Unit Type and Rh: 6200
Unit Type and Rh: 6200
Unit Type and Rh: 6200
Unit Type and Rh: 6200
Unit Type and Rh: 6200

## 2021-11-19 LAB — PREPARE FRESH FROZEN PLASMA
Unit division: 0
Unit division: 0
Unit division: 0
Unit division: 0
Unit division: 0
Unit division: 0
Unit division: 0
Unit division: 0
Unit division: 0
Unit division: 0
Unit division: 0
Unit division: 0
Unit division: 0
Unit division: 0
Unit division: 0

## 2021-11-19 LAB — TYPE AND SCREEN
Unit division: 0
Unit division: 0
Unit division: 0
Unit division: 0
Unit division: 0
Unit division: 0
Unit division: 0
Unit division: 0
Unit division: 0
Unit division: 0
Unit division: 0
Unit division: 0
Unit division: 0
Unit division: 0
Unit division: 0
Unit division: 0
Unit division: 0
Unit division: 0
Unit division: 0
Unit division: 0
Unit division: 0
Unit division: 0
Unit division: 0
Unit division: 0
Unit division: 0
Unit division: 0
Unit division: 0
Unit division: 0
Unit division: 0
Unit division: 0
Unit division: 0
Unit division: 0

## 2021-11-19 LAB — BPAM RBC
Blood Product Expiration Date: 202304112359
Blood Product Expiration Date: 202304112359
Blood Product Expiration Date: 202304112359
Blood Product Expiration Date: 202304122359
Blood Product Expiration Date: 202305012359
Blood Product Expiration Date: 202305012359
Blood Product Expiration Date: 202305042359
Blood Product Expiration Date: 202305042359
Blood Product Expiration Date: 202305042359
Blood Product Expiration Date: 202305042359
Blood Product Expiration Date: 202305042359
Blood Product Expiration Date: 202305042359
Blood Product Expiration Date: 202305042359
Blood Product Expiration Date: 202305042359
Blood Product Expiration Date: 202305042359
Blood Product Expiration Date: 202305042359
Blood Product Expiration Date: 202305042359
Blood Product Expiration Date: 202305042359
Blood Product Expiration Date: 202305042359
Blood Product Expiration Date: 202305042359
Blood Product Expiration Date: 202305052359
Blood Product Expiration Date: 202305052359
Blood Product Expiration Date: 202305052359
Blood Product Expiration Date: 202305052359
Blood Product Expiration Date: 202305052359
Blood Product Expiration Date: 202305052359
Blood Product Expiration Date: 202305052359
Blood Product Expiration Date: 202305052359
Blood Product Expiration Date: 202305062359
Blood Product Expiration Date: 202305072359
Blood Product Expiration Date: 202305072359
Blood Product Expiration Date: 202305072359
ISSUE DATE / TIME: 202304021516
ISSUE DATE / TIME: 202304021519
ISSUE DATE / TIME: 202304021525
ISSUE DATE / TIME: 202304021529
ISSUE DATE / TIME: 202304021530
ISSUE DATE / TIME: 202304021530
ISSUE DATE / TIME: 202304021530
ISSUE DATE / TIME: 202304021530
ISSUE DATE / TIME: 202304021530
ISSUE DATE / TIME: 202304021530
ISSUE DATE / TIME: 202304021530
ISSUE DATE / TIME: 202304021536
ISSUE DATE / TIME: 202304021536
ISSUE DATE / TIME: 202304021536
ISSUE DATE / TIME: 202304021536
ISSUE DATE / TIME: 202304021541
ISSUE DATE / TIME: 202304021541
ISSUE DATE / TIME: 202304021541
ISSUE DATE / TIME: 202304021541
ISSUE DATE / TIME: 202304021546
ISSUE DATE / TIME: 202304021546
ISSUE DATE / TIME: 202304021546
ISSUE DATE / TIME: 202304021546
ISSUE DATE / TIME: 202304021556
ISSUE DATE / TIME: 202304021556
ISSUE DATE / TIME: 202304021556
ISSUE DATE / TIME: 202304021556
ISSUE DATE / TIME: 202304021610
ISSUE DATE / TIME: 202304021610
ISSUE DATE / TIME: 202304021610
ISSUE DATE / TIME: 202304021610
Unit Type and Rh: 5100
Unit Type and Rh: 5100
Unit Type and Rh: 5100
Unit Type and Rh: 5100
Unit Type and Rh: 5100
Unit Type and Rh: 5100
Unit Type and Rh: 5100
Unit Type and Rh: 5100
Unit Type and Rh: 5100
Unit Type and Rh: 5100
Unit Type and Rh: 5100
Unit Type and Rh: 5100
Unit Type and Rh: 5100
Unit Type and Rh: 5100
Unit Type and Rh: 5100
Unit Type and Rh: 5100
Unit Type and Rh: 5100
Unit Type and Rh: 5100
Unit Type and Rh: 5100
Unit Type and Rh: 5100
Unit Type and Rh: 5100
Unit Type and Rh: 5100
Unit Type and Rh: 5100
Unit Type and Rh: 5100
Unit Type and Rh: 5100
Unit Type and Rh: 5100
Unit Type and Rh: 5100
Unit Type and Rh: 5100
Unit Type and Rh: 5100
Unit Type and Rh: 5100
Unit Type and Rh: 5100
Unit Type and Rh: 5100

## 2021-11-19 NOTE — TOC CAGE-AID Note (Signed)
Transition of Care (TOC) - CAGE-AID Screening ? ? ?Patient Details  ?Name: Mark Banks ?MRN: [id number] ?Date of Birth: Mar 12, 1991 ? ?Transition of Care (TOC) CM/SW Contact:    ?Wallace Cogliano C Tarpley-Carter, LCSWA ?Phone Number: ?11/19/2021, 12:37 PM ? ? ?Clinical Narrative: ?Pt is unable to participate in Cage Aid. ?Pt is unresponsive.   ? ?Insurance underwriter, MSW, LCSW-A ?Pronouns:  She/Her/Hers ?[facility] ?Clinical Social Worker ?Direct Number:  [phone] ?[email or web address]  ? ?CAGE-AID Screening: ?Substance Abuse Screening unable to be completed due to: : Patient unable to participate ? ?  ?  ?  ?  ?  ? ?Substance Abuse Education Offered: No ? ?  ? ? ? ? ? ? ?

## 2021-11-22 ENCOUNTER — Encounter (HOSPITAL_COMMUNITY): Payer: Self-pay

## 2021-12-17 NOTE — ED Notes (Addendum)
Pulses return and to OR now!! ?
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
## 2021-12-17 NOTE — Procedures (Signed)
FAST Exam ? ?Pre-procedure diagnosis: ACLS, s/p blunt trauma ?Post-procedure diagnosis: same ? ?Procedure: FAST ? ?Surgeon: Kris Mouton, MD ? ?Description of procedure: The patient's abdomen was imaged in four regions with the ultrasound. First, the right upper quadrant was imaged. Large volume free fluid was seen between the right kidney and the liver in Morison's pouch. Next, the epigastrium was imaged. No significant pericardial effusion was seen. Next, the left upper quadrant was imaged. Large volume free fluid was seen between the left kidney and the spleen. Finally, the bladder was imaged. A small amount of free fluid was seen next to the bladder in the pelvis. ? ?Impression: Positive ? ?Diamantina Monks, MD ?General and Trauma Surgery ?[facility] ? ?
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
## 2021-12-17 NOTE — Anesthesia Postprocedure Evaluation (Signed)
Anesthesia Post Note ? ?Patient: Mark Banks ? ?Procedure(s) Performed: EXPLORATORY LAPAROTOMY (Abdomen) ? ?  ? ?Patient location: OR. ?Anesthesia Type: General ?Post-procedure mental status: Deceased. ?Anesthetic complications: no ?Comments: Pt expired in OR.  Pt arrived to OR with active CPR in progress.  ROSC was obtained for a brief period before VF recurred.  After multiple cycles administering ACLS, the code was called. ? ? ?No notable events documented. ? ?Last Vitals:  ?Vitals:  ? December 16, 2021 1523 2021-12-16 1530  ?Pulse:  76  ?Resp:  18  ?Temp: (!) 35 ?C   ?SpO2:  (!) 61%  ?  ?Last Pain:  ?Vitals:  ? 16-Dec-2021 1523  ?TempSrc: Temporal  ? ? ?  ?  ?  ?  ?  ?  ? ?Hue Steveson S ? ? ? ? ?
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
## 2021-12-17 NOTE — Anesthesia Preprocedure Evaluation (Signed)
Anesthesia Evaluation  ?Patient identified by MRN, date of birth, ID band ?Patient unresponsive ? ?Preop documentation limited or incomplete due to emergent nature of procedure. ? ?Airway ?Mallampati: Intubated ? ? ?Neck ROM: limited ? ? ?Comment: Profuse bleeding from inside mouth.   ? ?C-collar in place Dental ?  ?Pulmonary ? ?  ?breath sounds clear to auscultation ? ? ? ? ? ? Cardiovascular ? ?Rhythm:regular Rate:Tachycardia ? ? ?  ?Neuro/Psych ?  ? GI/Hepatic ?  ?Endo/Other  ? ? Renal/GU ?  ? ?  ?Musculoskeletal ? ? Abdominal ?  ?Peds ? Hematology ?  ?Anesthesia Other Findings ? ? Reproductive/Obstetrics ? ?  ? ? ? ? ? ? ? ? ? ? ? ? ? ?  ?  ? ? ? ? ? ? ? ? ?Anesthesia Physical ?Anesthesia Plan ? ?ASA: 5 and emergent ? ?Anesthesia Plan: General  ? ?Post-op Pain Management:   ? ?Induction: Intravenous ? ?PONV Risk Score and Plan: 2 and Treatment may vary due to age or medical condition ? ?Airway Management Planned: Oral ETT ? ?Additional Equipment: Arterial line ? ?Intra-op Plan:  ? ?Post-operative Plan: Post-operative intubation/ventilation ? ?Informed Consent:  ? ?Plan Discussed with: CRNA, Anesthesiologist and Surgeon ? ?Anesthesia Plan Comments: (Pt arrived to OR emergently.  CPR in progress upon arrival. )  ? ? ? ? ? ? ?Anesthesia Quick Evaluation ? ?
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
## 2021-12-17 NOTE — Transfer of Care (Signed)
Immediate Anesthesia Transfer of Care Note ? ?Patient: Mark Banks ? ?Procedure(s) Performed: EXPLORATORY LAPAROTOMY (Abdomen) ? ?Patient Location: deceased ? ?Anesthesia Type:deceased ? ?Level of Consciousness: deceased ? ?Airway & Oxygen Therapy: deceased ? ?Post-op Assessment: deceased ? ?Post vital signs: deceased ? ?Last Vitals:  ?Vitals Value Taken Time  ?BP    ?Temp    ?Pulse    ?Resp    ?SpO2    ? ? ?Last Pain:  ?Vitals:  ? 11/28/2021 1523  ?TempSrc: Temporal  ?   ? ?  ? ?Complications: No notable events documented. ?
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
## 2021-12-17 DEATH — deceased

## 2023-01-12 IMAGING — CR DG ABD PORTABLE 1V
2 series · 2 of 2 positions shown · non-contrast
Comparison: None.

CLINICAL DATA: Evaluate for retained foreign body.

EXAM:
PORTABLE ABDOMEN - 1 VIEW

[AP (1 of 2)]
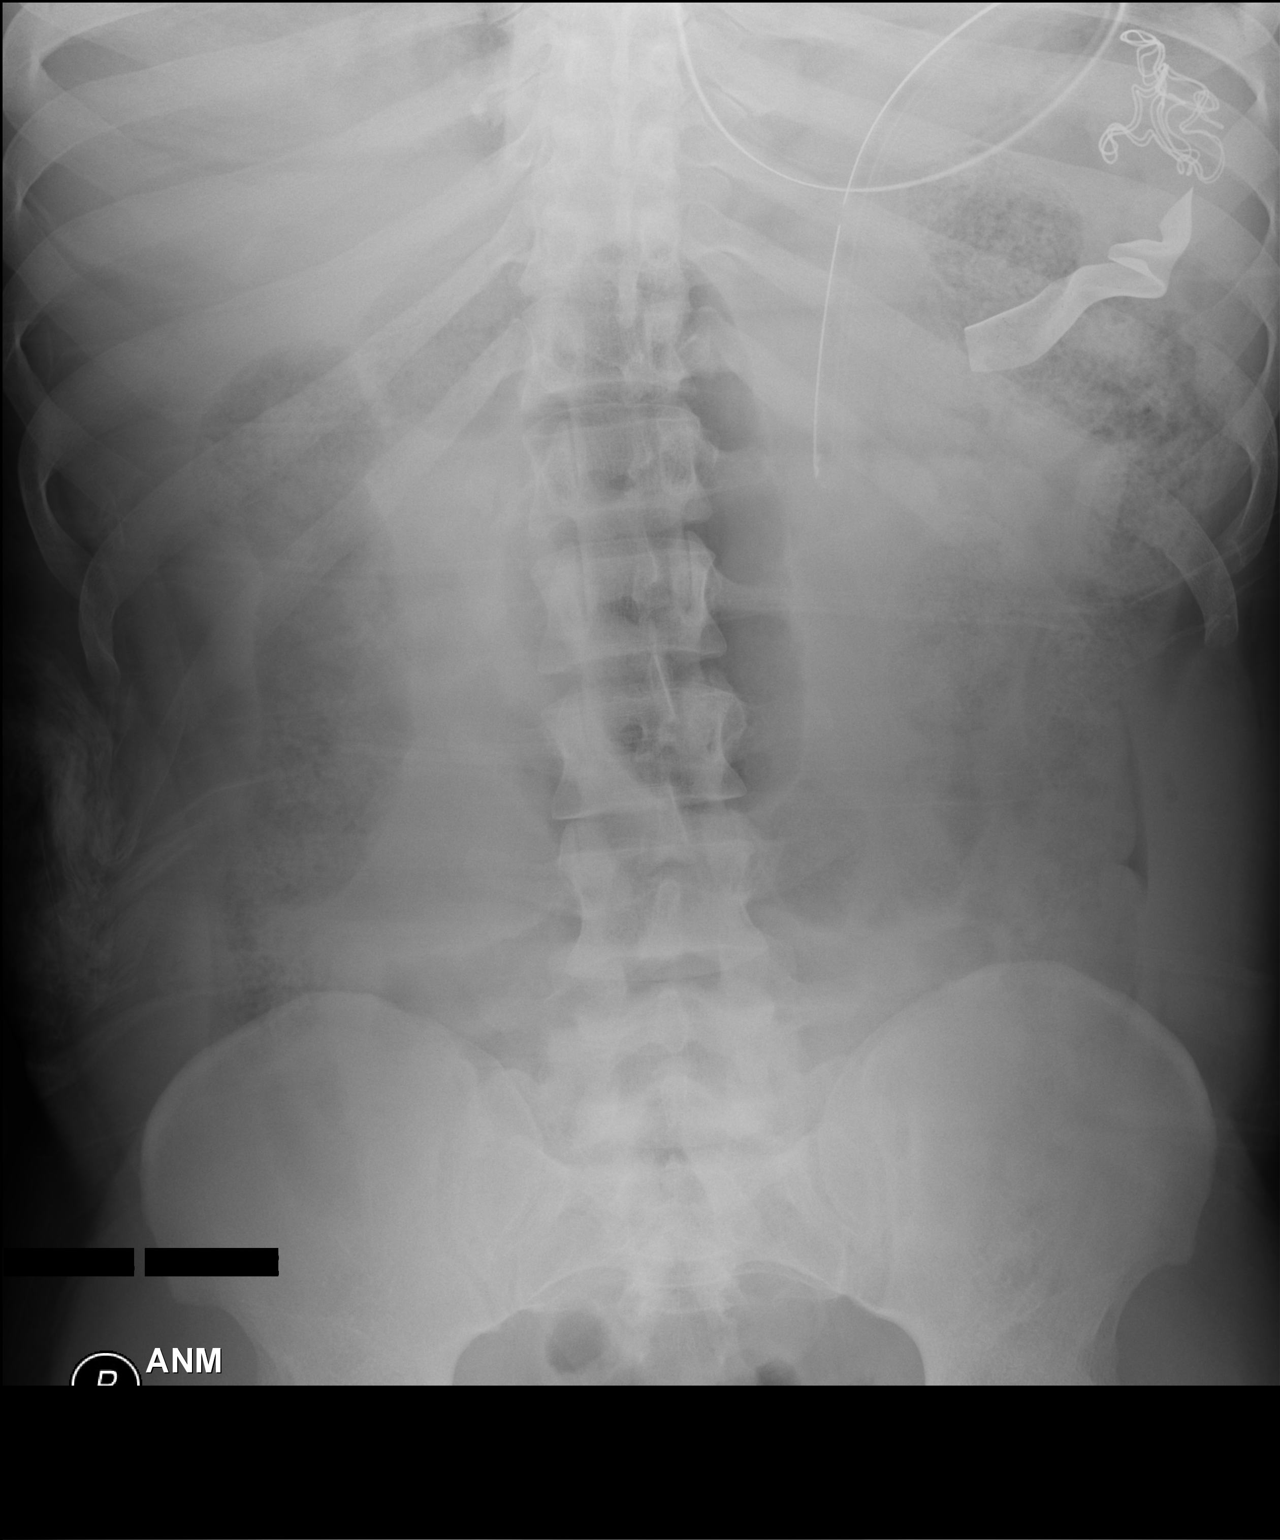

[AP (2 of 2)]
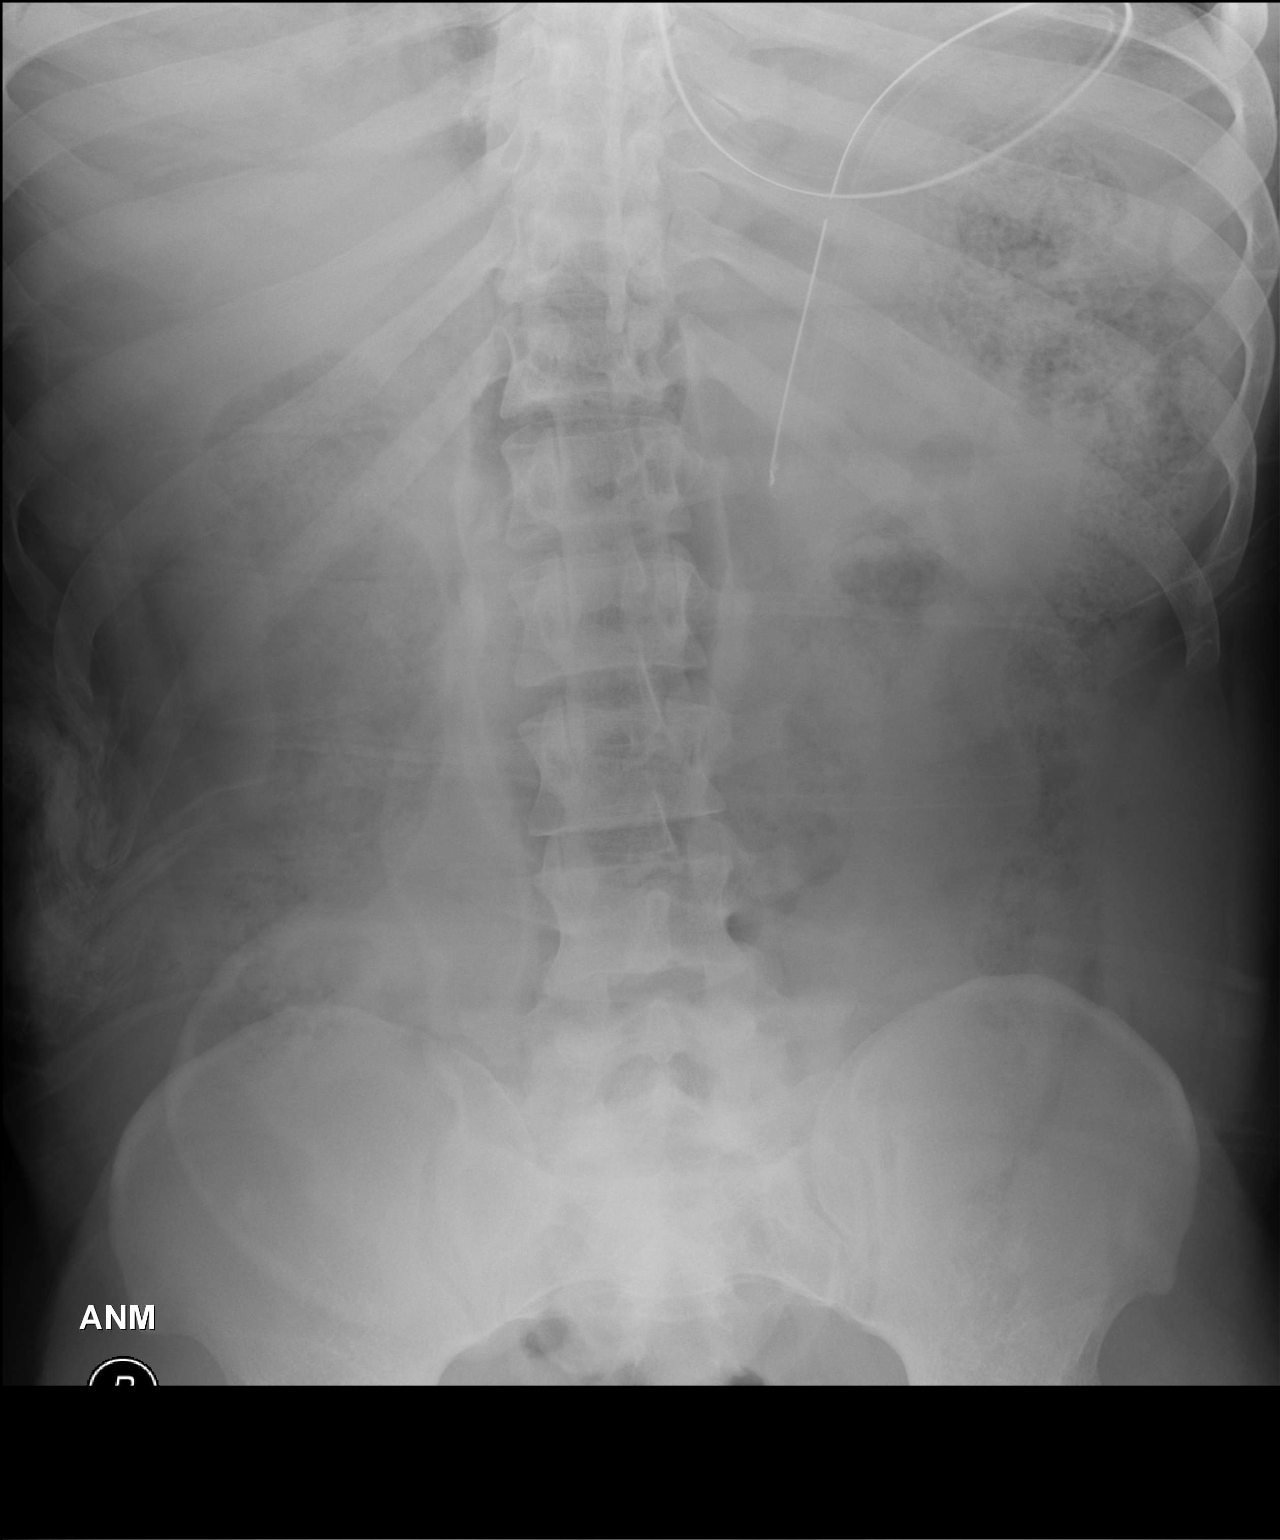

[2 of 2 positions shown; findings below may reference images not displayed]

FINDINGS: Retained surgical sponge is noted in the left upper abdomen on image
time stamped 2458 hours and is subsequently absent on follow-up
radiograph time stamped 2080 hours. No retained foreign bodies on
radiograph time stamped 2080 hours.

Enteric tube tip projects at the expected position of the distal
gastric body. Scattered air in the small bowel and colon with a
nonobstructive bowel gas pattern. Moderate fecal burden.
IMPRESSION: No retained foreign body.

Radiograph findings were called to the operating room at time of
dictation.
